# Patient Record
Sex: Male | Born: 1980 | Race: White | Hispanic: No | Marital: Single | Smoking: Never smoker
Health system: Southern US, Community
[De-identification: ages and names within clinical notes are randomized; demographics above are authoritative.]

## PROBLEM LIST (undated history)

## (undated) HISTORY — PX: ESOPHAGEAL DILATION: SHX303

---

## 2010-07-16 ENCOUNTER — Ambulatory Visit (HOSPITAL_COMMUNITY)
Admission: EM | Admit: 2010-07-16 | Discharge: 2010-07-16 | Disposition: A | Discharge status: Home / Self Care | Payer: Self-pay | Attending: Emergency Medicine | Admitting: Emergency Medicine

## 2010-07-16 ENCOUNTER — Emergency Department (HOSPITAL_COMMUNITY): Payer: Self-pay

## 2010-07-16 DIAGNOSIS — L408 Other psoriasis: Secondary | ICD-10-CM | POA: Insufficient documentation

## 2010-07-16 DIAGNOSIS — T18108A Unspecified foreign body in esophagus causing other injury, initial encounter: Secondary | ICD-10-CM | POA: Insufficient documentation

## 2010-07-16 DIAGNOSIS — IMO0002 Reserved for concepts with insufficient information to code with codable children: Secondary | ICD-10-CM | POA: Insufficient documentation

## 2010-07-16 DIAGNOSIS — R066 Hiccough: Secondary | ICD-10-CM | POA: Insufficient documentation

## 2010-08-08 NOTE — Op Note (Signed)
  NAMEANDREWS, TENER               ACCOUNT NO.:  [id number]  MEDICAL RECORD NO.:  [id number]           PATIENT TYPE:  E  LOCATION:  WLED                         FACILITY:  [facility] [place]  PHYSICIAN:  Shirley Friar, MDDATE OF BIRTH:  [birth year]  DATE OF PROCEDURE: DATE OF DISCHARGE:                              OPERATIVE REPORT   INDICATION:  Food impaction.  HISTORY:  The patient a 30 year old male with psoriasis who denies any previous episodes of dysphagia, heartburn or trouble eating who upon eating the first bite of pork chop this afternoon, which was too hot for him, he felt like it hung up and was unable to eat anymore afterwards and could not swallow his saliva since that time.  He tried to drink water since that time, but vomited it back up.  No help with glucagon in the emergency room.  MEDICATIONS: 1. Fentanyl 100 mcg IV. 2. Versed 10 mg IV. 3. Benadryl 25 mg IV.  FINDINGS:  Endoscope was inserted through oropharynx and esophagus was intubated.  In the mid esophagus was a lot of cloudy fluid and upon reaching the distal esophagus, there was a large food bolus that was obstructing the esophageal lumen.  A 4-pronged Talon grasper was used to remove several pieces of food including a large piece of food.  After several attempts, the endoscope was reinserted and Talon grasper was passed through the working channel of the endoscope and upon trying to grasp further pieces of the food bolus, the food bolus was able to be gently advanced into the stomach.  Upon careful inspection of esophagus, no esophageal stricture or ring was noted.  There was mild irritation of the focal area of the distal esophagus with erythema and ulceration consistent with trauma from the food bolus.  Endoscope was passed down to the stomach and revealed normal gastric mucosa.  Retroflexion was done, which revealed normal proximal stomach.  Endoscope was straightened, advanced into the duodenal bulb  and second portion of the duodenum, which were unremarkable.  Endoscope was withdrawn back into the esophagus and esophagus was again carefully evaluated and no esophageal stricture or ring was seen.  There were mild linear furrows noted proximal to the food bolus.  PLAN: 1. Full liquids until the morning and then chew meat slowly and eat     small pieces. 2. Prilosec 20 mg daily for 7 days. 3. If dysphagia recurs, then the patient may need to have esophageal     biopsies and/or dilation.  At this time, there is no visual     esophageal obstruction or esophageal ring, but if dysphagia occurs     further or if he has any additional food impaction, then may need     to consider biopsies to look for eosinophilic esophagitis.     Shirley Friar, MD     VCS/MEDQ  D:  07/16/2010  T:  07/17/2010  Job:  [id number]  Electronically Signed by Charlott Rakes MD on 08/08/2010 11:23:02 AM

## 2013-06-17 ENCOUNTER — Encounter: Payer: Self-pay | Admitting: Sports Medicine

## 2013-06-17 ENCOUNTER — Ambulatory Visit (INDEPENDENT_AMBULATORY_CARE_PROVIDER_SITE_OTHER): Payer: BC Managed Care – PPO | Admitting: Sports Medicine

## 2013-06-17 VITALS — BP 134/74 | HR 97 | Ht 71.0 in | Wt 191.0 lb

## 2013-06-17 DIAGNOSIS — Z299 Encounter for prophylactic measures, unspecified: Secondary | ICD-10-CM | POA: Insufficient documentation

## 2013-06-17 DIAGNOSIS — Z111 Encounter for screening for respiratory tuberculosis: Secondary | ICD-10-CM

## 2013-06-17 DIAGNOSIS — J309 Allergic rhinitis, unspecified: Secondary | ICD-10-CM

## 2013-06-17 DIAGNOSIS — H6120 Impacted cerumen, unspecified ear: Secondary | ICD-10-CM | POA: Insufficient documentation

## 2013-06-17 DIAGNOSIS — J3089 Other allergic rhinitis: Secondary | ICD-10-CM | POA: Insufficient documentation

## 2013-06-17 DIAGNOSIS — L409 Psoriasis, unspecified: Secondary | ICD-10-CM | POA: Insufficient documentation

## 2013-06-17 DIAGNOSIS — L408 Other psoriasis: Secondary | ICD-10-CM

## 2013-06-17 MED ORDER — FEXOFENADINE HCL 180 MG PO TABS
180.0000 mg | ORAL_TABLET | Freq: Every day | ORAL | Status: AC
Start: 2013-06-17 — End: ?

## 2013-06-17 MED ORDER — PREDNISONE 50 MG PO TABS: ORAL_TABLET | ORAL | Status: DC

## 2013-06-17 MED ORDER — MOMETASONE FUROATE 50 MCG/ACT NA SUSP: NASAL | Status: DC

## 2013-06-17 NOTE — Assessment & Plan Note (Signed)
Prednisone burst, referral to dermatology. He will likely need a biologic treatment, he had a good response to Enbrel in the past, we are going to plant a PPD which will be needed before he starts.

## 2013-06-17 NOTE — Progress Notes (Signed)
  Subjective:    CC: Establish care.   HPI:  Psoriasis: Has been present for approximately 10 years, no longer has a dermatologist and needs a referral. He did have a good response to Enbrel, did not respond to methotrexate. Symptoms are severe, persistent.  Allergic rhinitis: Did well with Nasonex, fexofenadine.  Past medical history, Surgical history, Family history not pertinant except as noted below, Social history, Allergies, and medications have been entered into the medical record, reviewed, and no changes needed.   Review of Systems: No headache, visual changes, nausea, vomiting, diarrhea, constipation, dizziness, abdominal pain, skin rash, fevers, chills, night sweats, swollen lymph nodes, weight loss, chest pain, body aches, joint swelling, muscle aches, shortness of breath, mood changes, visual or auditory hallucinations.  Objective:    General: Well Developed, well nourished, and in no acute distress.  Neuro: Alert and oriented x3, extra-ocular muscles intact, sensation grossly intact.  HEENT: Normocephalic, atraumatic, pupils equal round reactive to light, neck supple, no masses, no lymphadenopathy, thyroid nonpalpable. Left external ear canals occluded with cerumen. Skin: Warm and dry, moderate to severe psoriasis is located on his face, back, neck, abdomen. Cardiac: Regular rate and rhythm, no murmurs rubs or gallops.  Respiratory: Clear to auscultation bilaterally. Not using accessory muscles, speaking in full sentences.  Abdominal: Soft, nontender, nondistended, positive bowel sounds, no masses, no organomegaly.  Musculoskeletal: Shoulder, elbow, wrist, hip, knee, ankle stable, and with full range of motion.  Indication: Cerumen impaction of the ear(s) Medical necessity statement: On physical examination, cerumen impairs clinically significant portions of the external auditory canal, and tympanic membrane. Noted obstructive, copious cerumen that cannot be removed without  magnification and instrumentations requiring physician skills Consent: Discussed benefits and risks of procedure and verbal consent obtained Procedure: Patient was prepped for the procedure. Utilized an otoscope to assess and take note of the ear canal, the tympanic membrane, and the presence, amount, and placement of the cerumen. Gentle water irrigation and soft plastic curette was utilized to remove cerumen.  Post procedure examination: shows cerumen was completely removed. Patient tolerated procedure well. The patient is made aware that they may experience temporary vertigo, temporary hearing loss, and temporary discomfort. If these symptom last for more than 24 hours to call the clinic or proceed to the ED.  Impression and Recommendations:    The patient was counselled, risk factors were discussed, anticipatory guidance given.

## 2013-06-17 NOTE — Assessment & Plan Note (Signed)
Nasonex, fexofenadine. Return in a month.

## 2013-06-17 NOTE — Assessment & Plan Note (Signed)
Cerumen removal by physician with curette on the left side.

## 2013-06-17 NOTE — Assessment & Plan Note (Signed)
Checking routine bloodwork. 

## 2013-06-18 ENCOUNTER — Encounter: Payer: Self-pay | Admitting: Sports Medicine

## 2013-06-19 LAB — LIPID PANEL
Cholesterol: 205 mg/dL — ABNORMAL HIGH (ref 0–200)
HDL: 59 mg/dL (ref >39)
LDL Cholesterol: 137 mg/dL — ABNORMAL HIGH (ref 0–99)
Total CHOL/HDL Ratio: 3.5 ratio
Triglycerides: 45 mg/dL (ref <150)
VLDL: 9 mg/dL (ref 0–40)

## 2013-06-19 LAB — CBC
HCT: 41.9 % (ref 39.0–52.0)
Hemoglobin: 14.5 g/dL (ref 13.0–17.0)
MCH: 29.1 pg (ref 26.0–34.0)
MCHC: 34.6 g/dL (ref 30.0–36.0)
MCV: 84 fL (ref 78.0–100.0)
Platelets: 372 10*3/uL (ref 150–400)
RBC: 4.99 MIL/uL (ref 4.22–5.81)
RDW: 12.7 % (ref 11.5–15.5)
WBC: 13 K/uL — ABNORMAL HIGH (ref 4.0–10.5)

## 2013-06-19 LAB — COMPREHENSIVE METABOLIC PANEL
ALT: 31 U/L (ref 0–53)
AST: 22 U/L (ref 0–37)
Albumin: 4.7 g/dL (ref 3.5–5.2)
Alkaline Phosphatase: 54 U/L (ref 39–117)
BUN: 16 mg/dL (ref 6–23)
Potassium: 4.4 mEq/L (ref 3.5–5.3)
Sodium: 140 mEq/L (ref 135–145)
Total Protein: 7.8 g/dL (ref 6.0–8.3)

## 2013-06-19 LAB — COMPREHENSIVE METABOLIC PANEL WITH GFR
CO2: 28 meq/L (ref 19–32)
Calcium: 10.3 mg/dL (ref 8.4–10.5)
Chloride: 102 meq/L (ref 96–112)
Creat: 0.97 mg/dL (ref 0.50–1.35)
Glucose, Bld: 142 mg/dL — ABNORMAL HIGH (ref 70–99)
Total Bilirubin: 0.5 mg/dL (ref 0.2–1.2)

## 2013-06-19 LAB — TSH: TSH: 0.492 u[IU]/mL (ref 0.350–4.500)

## 2013-06-19 LAB — TB SKIN TEST
Induration: 0 mm
TB Skin Test: NEGATIVE

## 2013-06-20 LAB — HEMOGLOBIN A1C
Hgb A1c MFr Bld: 5.3 % (ref <5.7)
Mean Plasma Glucose: 105 mg/dL (ref <117)

## 2013-06-20 LAB — VITAMIN D 25 HYDROXY (VIT D DEFICIENCY, FRACTURES): Vit D, 25-Hydroxy: 69 ng/mL (ref 30–89)

## 2013-07-15 ENCOUNTER — Encounter: Payer: Self-pay | Admitting: Sports Medicine

## 2013-07-15 ENCOUNTER — Ambulatory Visit (INDEPENDENT_AMBULATORY_CARE_PROVIDER_SITE_OTHER): Payer: BC Managed Care – PPO | Admitting: Sports Medicine

## 2013-07-15 VITALS — BP 118/76 | HR 78 | Ht 71.0 in | Wt 193.0 lb

## 2013-07-15 DIAGNOSIS — L408 Other psoriasis: Secondary | ICD-10-CM

## 2013-07-15 DIAGNOSIS — J309 Allergic rhinitis, unspecified: Secondary | ICD-10-CM

## 2013-07-15 DIAGNOSIS — L409 Psoriasis, unspecified: Secondary | ICD-10-CM

## 2013-07-15 DIAGNOSIS — J3089 Other allergic rhinitis: Secondary | ICD-10-CM

## 2013-07-15 MED ORDER — FLUTICASONE PROPIONATE 50 MCG/ACT NA SUSP: NASAL | Status: DC

## 2013-07-15 MED ORDER — PREDNISONE (PAK) 10 MG PO TABS: ORAL_TABLET | ORAL | Status: DC

## 2013-07-15 NOTE — Progress Notes (Signed)
  Subjective:    CC: Followup  HPI: Psoriasis: Improve significantly with steroid burst, currently on biologic injections with his dermatologist.  Perennial allergic rhinitis: Resolved with antihistamines, he did use some Nasacort and was unable to afford Nasonex which was not covered.  Past medical history, Surgical history, Family history not pertinant except as noted below, Social history, Allergies, and medications have been entered into the medical record, reviewed, and no changes needed.   Review of Systems: No fevers, chills, night sweats, weight loss, chest pain, or shortness of breath.   Objective:    General: Well Developed, well nourished, and in no acute distress.  Neuro: Alert and oriented x3, extra-ocular muscles intact, sensation grossly intact.  HEENT: Normocephalic, atraumatic, pupils equal round reactive to light, neck supple, no masses, no lymphadenopathy, thyroid nonpalpable.  Skin: Warm and dry, psoriatic lesions are all cleared on his face, they're still present on the arms and abdomen. Cardiac: Regular rate and rhythm, no murmurs rubs or gallops, no lower extremity edema.  Respiratory: Clear to auscultation bilaterally. Not using accessory muscles, speaking in full sentences.  Impression and Recommendations:

## 2013-07-15 NOTE — Assessment & Plan Note (Signed)
Good response, unfortunately Nasonex was not covered, we are going to switch this to Flonase.

## 2013-07-15 NOTE — Assessment & Plan Note (Signed)
Excellent clearance of lesions with initial burst of prednisone, he is now on biologics injections with his dermatologist. I'm going to do an additional course of prednisone, this time a taper. He will continue treatment with his dermatologist.

## 2014-01-15 ENCOUNTER — Ambulatory Visit: Payer: BC Managed Care – PPO | Admitting: Sports Medicine

## 2014-09-20 ENCOUNTER — Emergency Department (HOSPITAL_BASED_OUTPATIENT_CLINIC_OR_DEPARTMENT_OTHER): Payer: 59

## 2014-09-20 ENCOUNTER — Emergency Department (HOSPITAL_BASED_OUTPATIENT_CLINIC_OR_DEPARTMENT_OTHER)
Admission: EM | Admit: 2014-09-20 | Discharge: 2014-09-20 | Disposition: A | Discharge status: Home / Self Care | Payer: 59 | Attending: Emergency Medicine | Admitting: Emergency Medicine

## 2014-09-20 ENCOUNTER — Encounter (HOSPITAL_BASED_OUTPATIENT_CLINIC_OR_DEPARTMENT_OTHER): Payer: Self-pay | Admitting: Emergency Medicine

## 2014-09-20 DIAGNOSIS — X58XXXA Exposure to other specified factors, initial encounter: Secondary | ICD-10-CM | POA: Diagnosis not present

## 2014-09-20 DIAGNOSIS — Y998 Other external cause status: Secondary | ICD-10-CM | POA: Insufficient documentation

## 2014-09-20 DIAGNOSIS — T18108A Unspecified foreign body in esophagus causing other injury, initial encounter: Secondary | ICD-10-CM | POA: Diagnosis present

## 2014-09-20 DIAGNOSIS — Z79899 Other long term (current) drug therapy: Secondary | ICD-10-CM | POA: Insufficient documentation

## 2014-09-20 DIAGNOSIS — Y9389 Activity, other specified: Secondary | ICD-10-CM | POA: Insufficient documentation

## 2014-09-20 DIAGNOSIS — Y9289 Other specified places as the place of occurrence of the external cause: Secondary | ICD-10-CM | POA: Insufficient documentation

## 2014-09-20 DIAGNOSIS — Z7951 Long term (current) use of inhaled steroids: Secondary | ICD-10-CM | POA: Diagnosis not present

## 2014-09-20 MED ORDER — ONDANSETRON HCL 4 MG/2ML IJ SOLN
4.0000 mg | Freq: Once | INTRAMUSCULAR | Status: AC
  Administered 2014-09-20: 4 mg via INTRAVENOUS
  Filled 2014-09-20: qty 2

## 2014-09-20 MED ORDER — GLUCAGON HCL RDNA (DIAGNOSTIC) 1 MG IJ SOLR
1.0000 mg | Freq: Once | INTRAMUSCULAR | Status: AC
  Administered 2014-09-20: 1 mg via INTRAVENOUS
  Filled 2014-09-20: qty 1

## 2014-09-20 MED ORDER — OMEPRAZOLE 20 MG PO CPDR: 20.0000 mg | DELAYED_RELEASE_CAPSULE | Freq: Every day | ORAL | Status: DC

## 2014-09-20 MED ORDER — GI COCKTAIL ~~LOC~~
ORAL | Status: AC
  Administered 2014-09-20: 30 mL
  Filled 2014-09-20: qty 30

## 2014-09-20 MED ORDER — SODIUM CHLORIDE 0.9 % IV BOLUS (SEPSIS)
1000.0000 mL | Freq: Once | INTRAVENOUS | Status: AC
  Administered 2014-09-20: 1000 mL via INTRAVENOUS

## 2014-09-20 NOTE — ED Provider Notes (Signed)
CSN: [id number]     Arrival date & time 09/20/14  0137 History   First MD Initiated Contact with Patient 09/20/14 0201     Chief Complaint  Patient presents with  . Foreign Body     (Consider location/radiation/quality/duration/timing/severity/associated sxs/prior Treatment) Patient is a 34 y.o. male presenting with foreign body swallowed. The history is provided by the patient.  Swallowed Foreign Body This is a recurrent problem. The current episode started 6 to 12 hours ago. The problem occurs constantly. The problem has not changed since onset.Pertinent negatives include no chest pain, no abdominal pain, no headaches and no shortness of breath. Nothing aggravates the symptoms. Nothing relieves the symptoms. He has tried nothing for the symptoms. The treatment provided no relief.  Ate steak at [employer] and felt it was stuck unable to keep down water.  Happened 4 years ago and was removed by GI and has happened several times since but usually clears at home.    History reviewed. No pertinent past medical history. History reviewed. No pertinent past surgical history. History reviewed. No pertinent family history. History  Substance Use Topics  . Smoking status: Never Smoker   . Smokeless tobacco: Not on file  . Alcohol Use: No    Review of Systems  Constitutional: Negative for fever.  Respiratory: Negative for shortness of breath.   Cardiovascular: Negative for chest pain.  Gastrointestinal: Negative for abdominal pain.  Neurological: Negative for headaches.  All other systems reviewed and are negative.     Allergies  Review of patient's allergies indicates no known allergies.  Home Medications   Prior to Admission medications   Medication Sig Start Date End Date Taking? Authorizing Provider  fexofenadine (ALLEGRA) 180 MG tablet Take 1 tablet (180 mg total) by mouth daily. 06/17/13   Monica Bectonhomas J Thekkekandam, MD  fluticasone (FLONASE) 50 MCG/ACT nasal spray One spray in each  nostril twice a day, use left hand for right nostril, and right hand for left nostril. 07/15/13   Monica Bectonhomas J Thekkekandam, MD  Multiple Vitamin (MULTIVITAMIN) tablet Take 1 tablet by mouth daily.    Historical Provider, MD  predniSONE (STERAPRED UNI-PAK) 10 MG tablet 12 day taper pack, use as directed 07/15/13   Monica Bectonhomas J Thekkekandam, MD  Secukinumab (COSENTYX [place]) Inject 300 mg into the skin.    Historical Provider, MD   BP 124/81 mmHg  Pulse 89  Temp(Src) 98.5 F (36.9 C) (Oral)  Resp 16  Ht 5\' 11"  (1.803 m)  Wt 195 lb (88.451 kg)  BMI 27.21 kg/m2  SpO2 100% Physical Exam  Constitutional: He is oriented to person, place, and time. He appears well-developed and well-nourished. No distress.  HENT:  Head: Normocephalic and atraumatic.  Mouth/Throat: Oropharynx is clear and moist.  Eyes: Conjunctivae are normal. Pupils are equal, round, and reactive to light.  Neck: Normal range of motion. Neck supple.  Cardiovascular: Normal rate, regular rhythm and intact distal pulses.   Pulmonary/Chest: Effort normal and breath sounds normal. No stridor. No respiratory distress. He has no wheezes. He has no rales.  Abdominal: Soft. Bowel sounds are normal. There is no tenderness. There is no rebound and no guarding.  Musculoskeletal: Normal range of motion.  Neurological: He is alert and oriented to person, place, and time.  Skin: Skin is warm and dry.  Psychiatric: He has a normal mood and affect.    ED Course  Procedures (including critical care time) Labs Review Labs Reviewed - No data to display  Imaging Review No results  found.   EKG Interpretation None      MDM   Final diagnoses:  None    Medications  sodium chloride 0.9 % bolus 1,000 mL (0 mLs Intravenous Stopped 09/20/14 0327)  ondansetron (ZOFRAN) injection 4 mg (4 mg Intravenous Given 09/20/14 0245)  glucagon (human recombinant) (GLUCAGEN) injection 1 mg (1 mg Intravenous Given 09/20/14 0254)  gi cocktail suspension (30 mLs   Given 09/20/14 0325)   Felt it pass post glucagon, now swallowing secretions and gi cocktail went down.  Prilosec x 1 month follow up with GI for endoscopy.  Soft diet until cleared by GI   Adaja Wander, MD 09/20/14 [phone]

## 2014-09-20 NOTE — ED Notes (Signed)
Patient states that he ate a piece of steak about 9 pm and feels like there is a piece stuck in his throat

## 2014-09-23 ENCOUNTER — Telehealth: Payer: Self-pay

## 2014-09-23 DIAGNOSIS — L409 Psoriasis, unspecified: Secondary | ICD-10-CM

## 2014-09-23 DIAGNOSIS — T18108A Unspecified foreign body in esophagus causing other injury, initial encounter: Secondary | ICD-10-CM | POA: Insufficient documentation

## 2014-09-23 NOTE — Telephone Encounter (Signed)
Patient wife called requesting a referral for Dermatology for Psoriasis  and Gastroenterology. Katreena Schupp,CMA

## 2014-09-23 NOTE — Telephone Encounter (Signed)
Orders placed.

## 2015-04-07 ENCOUNTER — Telehealth: Payer: Self-pay

## 2015-04-07 DIAGNOSIS — J3089 Other allergic rhinitis: Secondary | ICD-10-CM

## 2015-04-07 NOTE — Telephone Encounter (Signed)
Referral placed.

## 2015-04-07 NOTE — Telephone Encounter (Signed)
Pt would like to be referred to an allergist. States has a history and has received injections as a child.

## 2015-11-12 ENCOUNTER — Encounter: Payer: Self-pay | Admitting: Sports Medicine

## 2015-11-12 ENCOUNTER — Ambulatory Visit (INDEPENDENT_AMBULATORY_CARE_PROVIDER_SITE_OTHER): Payer: BLUE CROSS/BLUE SHIELD | Admitting: Sports Medicine

## 2015-11-12 DIAGNOSIS — L409 Psoriasis, unspecified: Secondary | ICD-10-CM | POA: Diagnosis not present

## 2015-11-12 DIAGNOSIS — Z299 Encounter for prophylactic measures, unspecified: Secondary | ICD-10-CM

## 2015-11-12 LAB — HEMOGLOBIN A1C
Hgb A1c MFr Bld: 5.1 % (ref <5.7)
Mean Plasma Glucose: 100 mg/dL

## 2015-11-12 LAB — CBC
HCT: 43.4 % (ref 38.5–50.0)
Hemoglobin: 14.7 g/dL (ref 13.2–17.1)
MCH: 28.8 pg (ref 27.0–33.0)
MCHC: 33.9 g/dL (ref 32.0–36.0)
MCV: 84.9 fL (ref 80.0–100.0)
MPV: 8.9 fL (ref 7.5–12.5)
Platelets: 337 10*3/uL (ref 140–400)
RBC: 5.11 MIL/uL (ref 4.20–5.80)
RDW: 12.9 % (ref 11.0–15.0)
WBC: 8.1 K/uL (ref 3.8–10.8)

## 2015-11-12 NOTE — Progress Notes (Signed)
  Subjective:    CC: Complete physical exam  HPI:  This is a pleasant 35 year old male here for his physical, psoriasis is doing okay, he did, his medication because of insurance issue, but things are improving. He is having some issues with low sex drive and would like me to check his testosterone.  Past medical history, Surgical history, Family history not pertinant except as noted below, Social history, Allergies, and medications have been entered into the medical record, reviewed, and no changes needed.   Review of Systems: No headache, visual changes, nausea, vomiting, diarrhea, constipation, dizziness, abdominal pain, skin rash, fevers, chills, night sweats, swollen lymph nodes, weight loss, chest pain, body aches, joint swelling, muscle aches, shortness of breath, mood changes, visual or auditory hallucinations.  Objective:   General: Well Developed, well nourished, and in no acute distress.  Neuro: Alert and oriented x3, extra-ocular muscles intact, sensation grossly intact. Cranial nerves II through XII are intact, motor, sensory, and coordinative functions are all intact. HEENT: Normocephalic, atraumatic, pupils equal round reactive to light, neck supple, no masses, no lymphadenopathy, thyroid nonpalpable. Oropharynx, nasopharynx, external ear canals are unremarkable. Skin: Warm and dry, no rashes noted.  Cardiac: Regular rate and rhythm, no murmurs rubs or gallops.  Respiratory: Clear to auscultation bilaterally. Not using accessory muscles, speaking in full sentences.  Abdominal: Soft, nontender, nondistended, positive bowel sounds, no masses, no organomegaly.  Musculoskeletal: Shoulder, elbow, wrist, hip, knee, ankle stable, and with full range of motion.  Impression and Recommendations:    The patient was counselled, risk factors were discussed, anticipatory guidance given.

## 2015-11-12 NOTE — Assessment & Plan Note (Signed)
Complete physical as above with routine blood work including tuberculosis screening, patient is relatively immune compromised with medication Having somewhat low sex drive, checking testosterone, FSH, LH, sexual therapy if insufficient response.

## 2015-11-13 LAB — COMPREHENSIVE METABOLIC PANEL
BUN: 11 mg/dL (ref 7–25)
CO2: 26 mmol/L (ref 20–31)
Chloride: 103 mmol/L (ref 98–110)
Creat: 1.02 mg/dL (ref 0.60–1.35)
Sodium: 140 mmol/L (ref 135–146)

## 2015-11-13 LAB — TSH: TSH: 1.7 mIU/L (ref 0.40–4.50)

## 2015-11-13 LAB — COMPREHENSIVE METABOLIC PANEL WITH GFR
ALT: 29 U/L (ref 9–46)
AST: 23 U/L (ref 10–40)
Albumin: 4.6 g/dL (ref 3.6–5.1)
Alkaline Phosphatase: 45 U/L (ref 40–115)
Calcium: 9.5 mg/dL (ref 8.6–10.3)
Glucose, Bld: 91 mg/dL (ref 65–99)
Potassium: 4.4 mmol/L (ref 3.5–5.3)
Total Bilirubin: 0.8 mg/dL (ref 0.2–1.2)
Total Protein: 7.3 g/dL (ref 6.1–8.1)

## 2015-11-13 LAB — LIPID PANEL
Cholesterol: 198 mg/dL (ref 125–200)
HDL: 57 mg/dL (ref >=40)
LDL Cholesterol: 122 mg/dL (ref <130)
Total CHOL/HDL Ratio: 3.5 Ratio (ref <=5.0)
Triglycerides: 94 mg/dL (ref <150)
VLDL: 19 mg/dL (ref <30)

## 2015-11-13 LAB — LUTEINIZING HORMONE: LH: 5.5 m[IU]/mL (ref 1.5–9.3)

## 2015-11-13 LAB — VITAMIN D 25 HYDROXY (VIT D DEFICIENCY, FRACTURES): Vit D, 25-Hydroxy: 31 ng/mL (ref 30–100)

## 2015-11-13 LAB — FOLLICLE STIMULATING HORMONE: FSH: 4.3 m[IU]/mL (ref 1.6–8.0)

## 2015-11-14 LAB — QUANTIFERON TB GOLD ASSAY (BLOOD)
Interferon Gamma Release Assay: NEGATIVE
Mitogen-Nil: >10 IU/mL
Quantiferon Nil Value: 0.02 [IU]/mL
Quantiferon Tb Ag Minus Nil Value: 0 IU/mL

## 2015-11-15 LAB — TESTOS,TOTAL,FREE AND SHBG (FEMALE)
Sex Hormone Binding Glob.: 22 nmol/L (ref 10–50)
Testosterone, Free: 80.3 pg/mL (ref 35.0–155.0)
Testosterone,Total,LC/MS/MS: 456 ng/dL (ref 250–1100)

## 2016-04-06 IMAGING — DX DG NECK SOFT TISSUE
2 series · 2 of 2 positions shown · non-contrast
Comparison: None.

CLINICAL DATA: Sensation of piece of steak stuck in throat. Initial
encounter.

EXAM:
NECK SOFT TISSUES - 1+ VIEW

[neck lat]
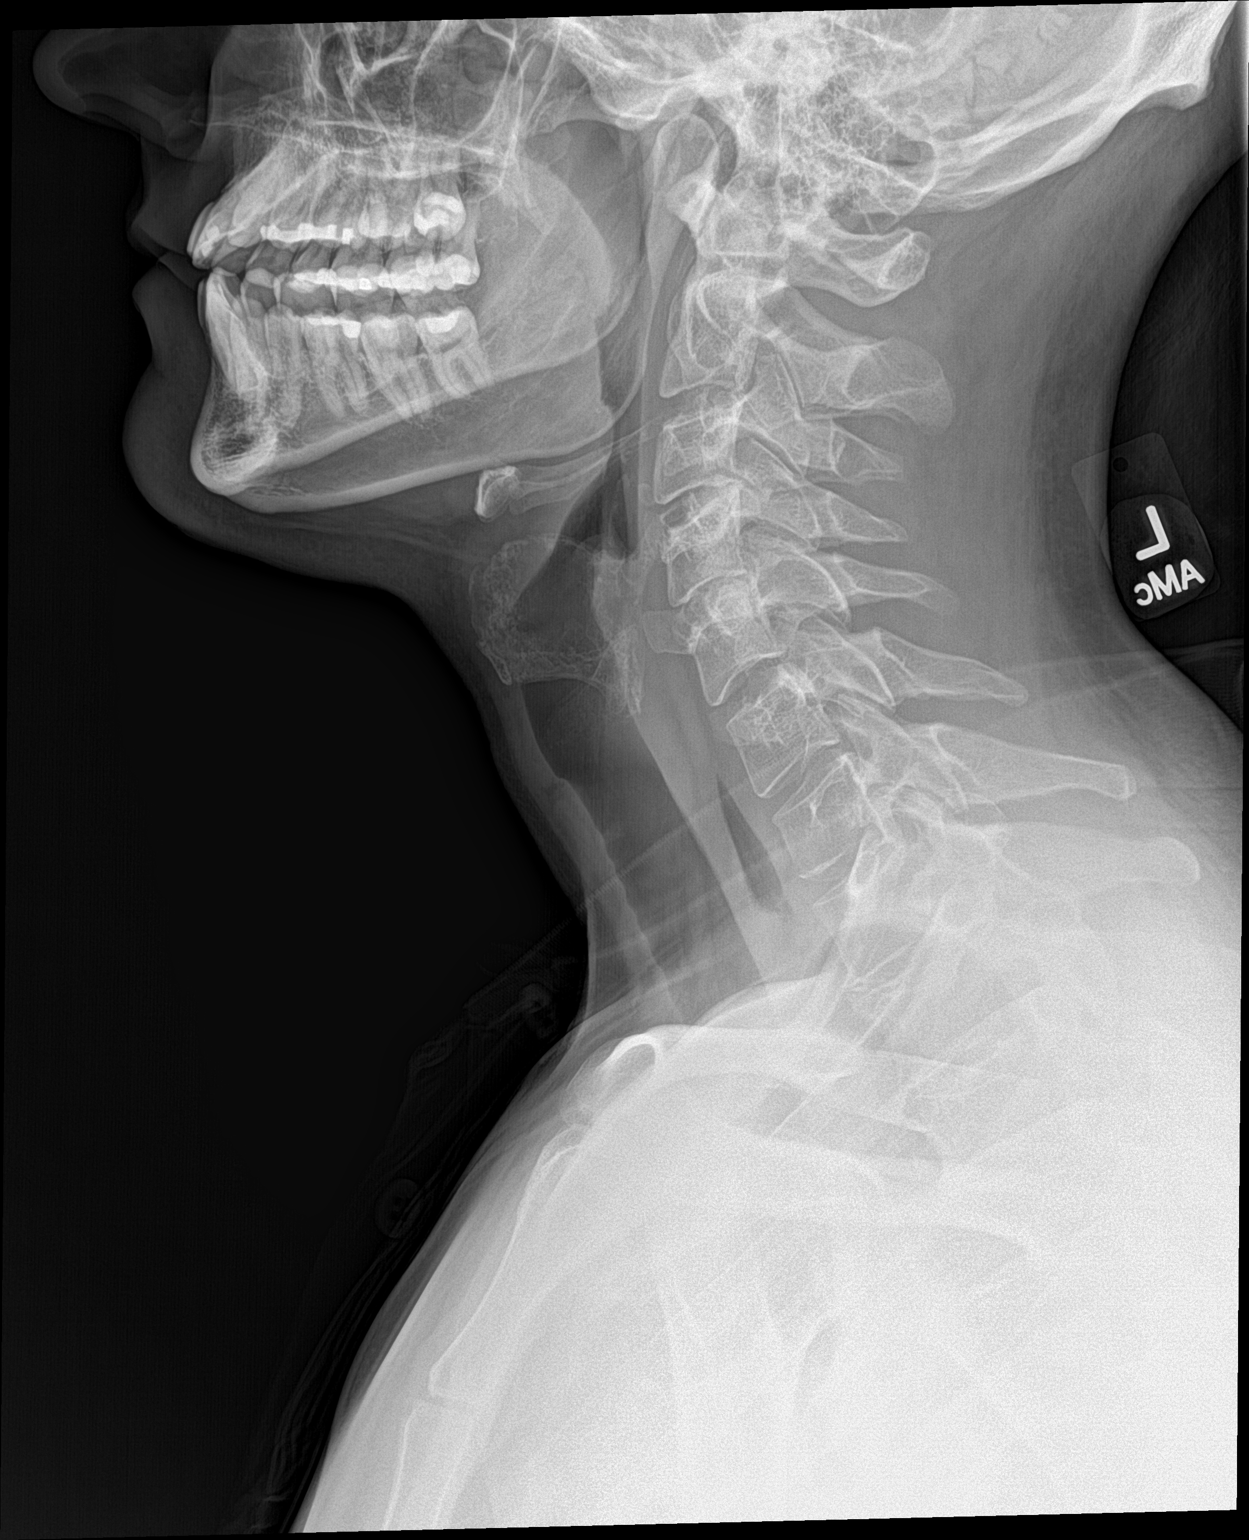

[neck ap]
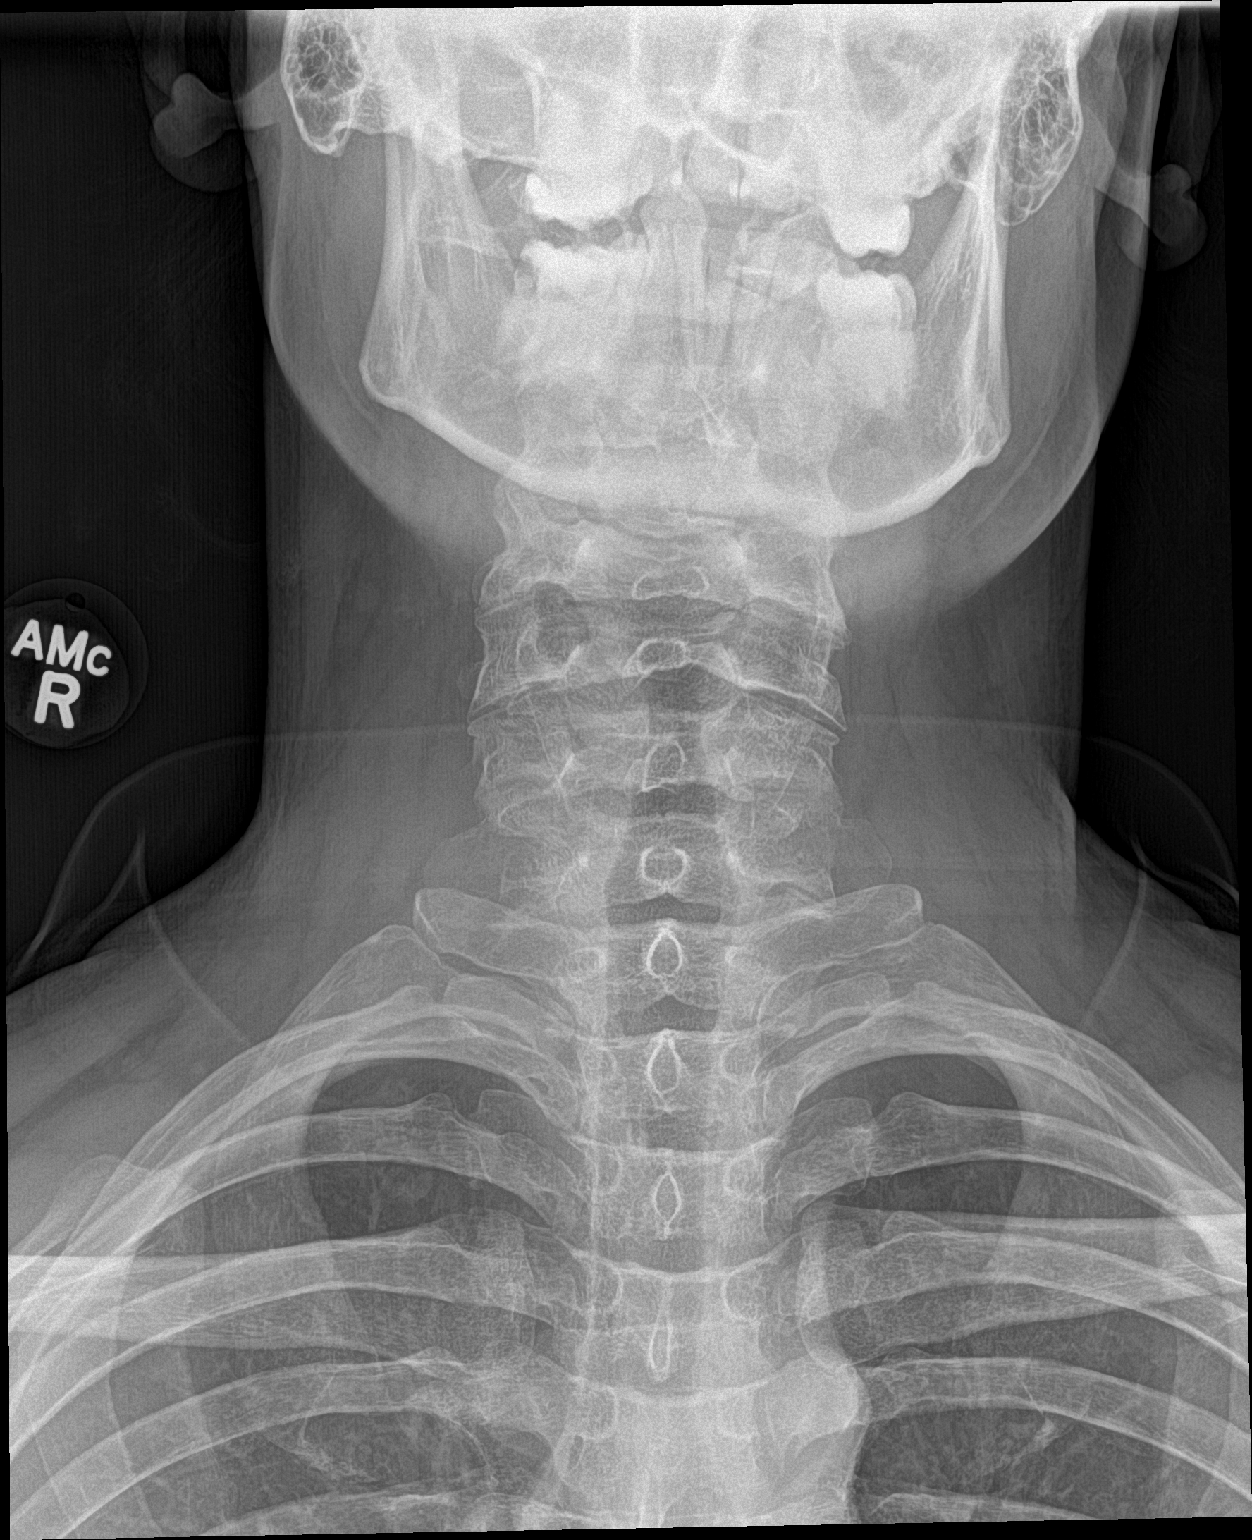

[2 of 2 positions shown; findings below may reference images not displayed]

FINDINGS: The nasopharynx, oropharynx and hypopharynx are unremarkable. The
proximal trachea is grossly unremarkable.

The pattern of air within the proximal esophagus suggests layering
fluid within the proximal esophagus. A piece of steak stuck within
the proximal to mid esophagus cannot be excluded, given clinical
concern.

Prevertebral soft tissues are within normal limits. There is no
evidence of fracture or subluxation along the cervical spine.
Vertebral bodies demonstrate normal height and alignment.
Intervertebral disc spaces are preserved. The visualized paranasal
sinuses and mastoid air cells are well-aerated.

The visualized portions of the lung apices are grossly clear.
IMPRESSION: Pattern of air within the proximal esophagus suggests layering fluid
in the proximal esophagus. Given clinical concern, this could
reflect a piece of steak stuck within the proximal to mid esophagus.
Would follow-up for persistent symptoms.

## 2016-06-26 ENCOUNTER — Ambulatory Visit (HOSPITAL_BASED_OUTPATIENT_CLINIC_OR_DEPARTMENT_OTHER)
Admission: EM | Admit: 2016-06-26 | Discharge: 2016-06-26 | Disposition: A | Discharge status: Home / Self Care | Payer: BLUE CROSS/BLUE SHIELD | Attending: Emergency Medicine | Admitting: Emergency Medicine

## 2016-06-26 ENCOUNTER — Encounter (HOSPITAL_COMMUNITY)
Admission: EM | Disposition: A | Discharge status: Home / Self Care | Payer: Self-pay | Source: Home / Self Care | Attending: Emergency Medicine

## 2016-06-26 ENCOUNTER — Encounter (HOSPITAL_BASED_OUTPATIENT_CLINIC_OR_DEPARTMENT_OTHER): Payer: Self-pay | Admitting: *Deleted

## 2016-06-26 DIAGNOSIS — X58XXXA Exposure to other specified factors, initial encounter: Secondary | ICD-10-CM | POA: Insufficient documentation

## 2016-06-26 DIAGNOSIS — K209 Esophagitis, unspecified: Secondary | ICD-10-CM | POA: Diagnosis not present

## 2016-06-26 DIAGNOSIS — T18128A Food in esophagus causing other injury, initial encounter: Secondary | ICD-10-CM | POA: Insufficient documentation

## 2016-06-26 DIAGNOSIS — W44F3XA Food entering into or through a natural orifice, initial encounter: Secondary | ICD-10-CM | POA: Insufficient documentation

## 2016-06-26 HISTORY — PX: ESOPHAGOGASTRODUODENOSCOPY: SHX5428

## 2016-06-26 SURGERY — EGD (ESOPHAGOGASTRODUODENOSCOPY)
Anesthesia: Moderate Sedation

## 2016-06-26 MED ORDER — OMEPRAZOLE 40 MG PO CPDR: 40.0000 mg | DELAYED_RELEASE_CAPSULE | Freq: Every day | ORAL | 0 refills | Status: DC

## 2016-06-26 MED ORDER — FENTANYL CITRATE (PF) 100 MCG/2ML IJ SOLN
INTRAMUSCULAR | Status: AC
  Filled 2016-06-26: qty 2

## 2016-06-26 MED ORDER — FENTANYL CITRATE (PF) 100 MCG/2ML IJ SOLN
INTRAMUSCULAR | Status: DC | PRN
  Administered 2016-06-26 (×3): 25 ug via INTRAVENOUS

## 2016-06-26 MED ORDER — MIDAZOLAM HCL 10 MG/2ML IJ SOLN
INTRAMUSCULAR | Status: DC | PRN
  Administered 2016-06-26 (×2): 2 mg via INTRAVENOUS
  Administered 2016-06-26: 1 mg via INTRAVENOUS

## 2016-06-26 MED ORDER — NITROGLYCERIN 0.4 MG SL SUBL
0.4000 mg | SUBLINGUAL_TABLET | Freq: Once | SUBLINGUAL | Status: AC
  Administered 2016-06-26: 0.4 mg via SUBLINGUAL
  Filled 2016-06-26: qty 1

## 2016-06-26 MED ORDER — DIPHENHYDRAMINE HCL 50 MG/ML IJ SOLN
INTRAMUSCULAR | Status: AC
  Filled 2016-06-26: qty 1

## 2016-06-26 MED ORDER — SODIUM CHLORIDE 0.9 % IV BOLUS (SEPSIS)
1000.0000 mL | Freq: Once | INTRAVENOUS | Status: AC
  Administered 2016-06-26: 1000 mL via INTRAVENOUS

## 2016-06-26 MED ORDER — LORAZEPAM 2 MG/ML IJ SOLN
0.5000 mg | Freq: Once | INTRAMUSCULAR | Status: AC
  Administered 2016-06-26: 0.5 mg via INTRAVENOUS
  Filled 2016-06-26: qty 1

## 2016-06-26 MED ORDER — DIPHENHYDRAMINE HCL 50 MG/ML IJ SOLN
INTRAMUSCULAR | Status: DC | PRN
  Administered 2016-06-26: 50 mg via INTRAVENOUS

## 2016-06-26 MED ORDER — MIDAZOLAM HCL 5 MG/ML IJ SOLN
INTRAMUSCULAR | Status: AC
  Filled 2016-06-26: qty 2

## 2016-06-26 MED ORDER — GLUCAGON HCL RDNA (DIAGNOSTIC) 1 MG IJ SOLR
1.0000 mg | Freq: Once | INTRAMUSCULAR | Status: AC
  Administered 2016-06-26: 1 mg via INTRAVENOUS
  Filled 2016-06-26: qty 1

## 2016-06-26 NOTE — ED Provider Notes (Signed)
Patient evaluated by GI and had egd with pushing of food into stomach.   Gi indicates to nursing pt ready for d/c.   Patient is awake and alert. No resp depression.  Pulse ox 98%.  Patient is asymptomatic, and currently appears stable for d/c.  D/c instructions per gi.  Vitals:   06/26/16 1615 06/26/16 1630  BP: 105/63 114/76  Pulse: 81 86  Resp: 12 21  Temp:        Cathren LaineKevin Ronny Korff, MD 06/26/16 1645

## 2016-06-26 NOTE — Interval H&P Note (Signed)
History and Physical Interval Note:  06/26/2016 2:56 PM  Keith Stewart  has presented today for surgery, with the diagnosis of food impaction  The various methods of treatment have been discussed with the patient and family. After consideration of risks, benefits and other options for treatment, the patient has consented to  Procedure(s): ESOPHAGOGASTRODUODENOSCOPY (EGD) (N/A) as a surgical intervention .  The patient's history has been reviewed, patient examined, no change in status, stable for surgery.  I have reviewed the patient's chart and labs.  Questions were answered to the patient's satisfaction.     Reeves ForthSteven Paul Jettson Crable

## 2016-06-26 NOTE — ED Notes (Signed)
Bed: [id number] Expected date:  Expected time:  Means of arrival:  Comments: Endoscopy is being performed. Pt. In "ENDO" in EP[facility]

## 2016-06-26 NOTE — ED Provider Notes (Signed)
[facility] Provider Note   CSN: [id number] Arrival date & time: 06/26/16  [id number]     History   Chief Complaint Chief Complaint  Patient presents with  . Airway Obstruction    HPI Keith Stewart is a 36 y.o. male with a PMHx of psoriasis and esophageal impaction x2 (most recently 09/20/14, cleared after 1mg  glucagon in the ED) s/p esophageal dilation by Dr. Norma Fredricksonoledo of [facility] in [place] in late 2016, who presents to the ED with complaints of feeling like there is food stuck in his throat. Patient states that he was eating boneless chicken last night around 7:30pm when a piece got stuck in his throat. He attempted to drink Coke and water to try to pass the impaction, but every time he tried it seemed to get stuck in his upper esophagus and then he regurgitated it back up. He has been unable to tolerate any liquids since this happened. He reports that he has been drooling and has required spitting his saliva out due to inability to pass it around the obstruction. He has not tried anything other than Coke and water. Symptoms worsen with eating/drinking. He also reports having a mild sore throat due to the gagging and regurgitation when he was trying to pass the obstruction yesterday. He states that last time this happened, when he was in the ED on 09/20/14, he was able to pass the obstruction after 1 mg glucagon and drinking a soda so he is hopeful that it will pass again today without requiring an EGD, given that today's impaction seems milder than previous impactions. Per his report, his last EGD in 2016 did not reveal any "rings" or strictures that would be the source of his food impactions, so his GI dr thought that maybe it's due to a food allergy; however they have not found a specific cause to this issue yet.   He denies fevers, chills, trismus, wheezing, CP, SOB, abd pain, N/V/D/C, hematuria, dysuria, myalgias, arthralgias, numbness, tingling, focal weakness,  or any other complaints at this time.    The history is provided by the patient and medical records. No language interpreter was used.  GI Problem  This is a recurrent problem. The current episode started yesterday. The problem occurs constantly. The problem has not changed since onset.Pertinent negatives include no chest pain, no abdominal pain and no shortness of breath. The symptoms are aggravated by eating and drinking. Nothing relieves the symptoms. He has tried water for the symptoms. The treatment provided no relief.    History reviewed. No pertinent past medical history.  Patient Active Problem List   Diagnosis Date Noted  . Esophageal foreign body 09/23/2014  . Psoriasis 06/17/2013  . Preventive measure 06/17/2013  . Perennial allergic rhinitis 06/17/2013    Past Surgical History:  Procedure Laterality Date  . ESOPHAGEAL DILATION         Home Medications    Prior to Admission medications   Medication Sig Start Date End Date Taking? Authorizing Provider  fexofenadine (ALLEGRA) 180 MG tablet Take 1 tablet (180 mg total) by mouth daily. 06/17/13   Monica Bectonhomas J Thekkekandam, MD  fluticasone (FLONASE) 50 MCG/ACT nasal spray One spray in each nostril twice a day, use left hand for right nostril, and right hand for left nostril. 07/15/13   Monica Bectonhomas J Thekkekandam, MD  Multiple Vitamin (MULTIVITAMIN) tablet Take 1 tablet by mouth daily.    Historical Provider, MD    Family History History reviewed. No pertinent family  history.  Social History Social History  Substance Use Topics  . Smoking status: Never Smoker  . Smokeless tobacco: Never Used  . Alcohol use No     Allergies   Patient has no known allergies.   Review of Systems Review of Systems  Constitutional: Negative for chills and fever.  HENT: Positive for drooling, sore throat and trouble swallowing.   Respiratory: Negative for shortness of breath and wheezing.   Cardiovascular: Negative for chest pain.    Gastrointestinal: Negative for abdominal pain, constipation, diarrhea, nausea and vomiting.  Genitourinary: Negative for dysuria and hematuria.  Musculoskeletal: Negative for arthralgias and myalgias.  Skin: Negative for color change.  Allergic/Immunologic: Negative for immunocompromised state.  Neurological: Negative for weakness and numbness.  Psychiatric/Behavioral: Negative for confusion.   10 Systems reviewed and are negative for acute change except as noted in the HPI.   Physical Exam Updated Vital Signs BP 135/93 (BP Location: Right Arm)   Pulse 88   Temp 98.7 F (37.1 C) (Oral)   Resp 16   Ht 5\' 11"  (1.803 m)   Wt 88.5 kg   SpO2 98%   BMI 27.20 kg/m   Physical Exam  Constitutional: He is oriented to person, place, and time. Vital signs are normal. He appears well-developed and well-nourished.  Non-toxic appearance. No distress.  Afebrile, nontoxic, NAD  HENT:  Head: Normocephalic and atraumatic.  Nose: Nose normal.  Mouth/Throat: Uvula is midline and mucous membranes are normal. No trismus in the jaw. No uvula swelling. Posterior oropharyngeal erythema present. No oropharyngeal exudate, posterior oropharyngeal edema or tonsillar abscesses. Tonsils are 0 on the right. Tonsils are 0 on the left. No tonsillar exudate.  Pt is spitting saliva out into a cup, but doesn't appear to be drooling. Oropharynx mildly injected however airway intact, no significant edema, without uvular swelling or deviation, no trismus, no tonsillar swelling, no exudates. No stridor or wheezing, no voice hoarseness.  Eyes: Conjunctivae and EOM are normal. Right eye exhibits no discharge. Left eye exhibits no discharge.  Neck: Trachea normal, normal range of motion and phonation normal. Neck supple. No tracheal tenderness present. No tracheal deviation present. No thyroid mass and no thyromegaly present.  No thyromegaly or thyroid masses appreciated. No tracheal tenderness or deviation. No stridor or  vocal hoarseness.   Cardiovascular: Normal rate, regular rhythm, normal heart sounds and intact distal pulses.  Exam reveals no gallop and no friction rub.   No murmur heard. Pulmonary/Chest: Effort normal and breath sounds normal. No stridor. No respiratory distress. He has no decreased breath sounds. He has no wheezes. He has no rhonchi. He has no rales.  CTAB in all lung fields, no w/r/r, no hypoxia or increased WOB, speaking in full sentences, SpO2 98% on RA   Abdominal: Soft. Normal appearance and bowel sounds are normal. He exhibits no distension. There is no tenderness. There is no rigidity, no rebound, no guarding, no CVA tenderness, no tenderness at McBurney's point and negative Murphy's sign.  Soft, NTND, +BS throughout, no r/g/r, neg murphy's, neg mcburney's, no CVA TTP   Musculoskeletal: Normal range of motion.  Neurological: He is alert and oriented to person, place, and time. He has normal strength. No sensory deficit.  Skin: Skin is warm, dry and intact. No rash noted.  Psychiatric: He has a normal mood and affect.  Nursing note and vitals reviewed.    ED Treatments / Results  Labs (all labs ordered are listed, but only abnormal results are displayed) Labs Reviewed -  No data to display  EKG  EKG Interpretation None       Radiology No results found.  Procedures Procedures (including critical care time)  Medications Ordered in ED Medications  glucagon (human recombinant) (GLUCAGEN) injection 1 mg (1 mg Intravenous Given 06/26/16 1103)  nitroGLYCERIN (NITROSTAT) SL tablet 0.4 mg (0.4 mg Sublingual Given 06/26/16 1135)  LORazepam (ATIVAN) injection 0.5 mg (0.5 mg Intravenous Given 06/26/16 1107)  sodium chloride 0.9 % bolus 1,000 mL (0 mLs Intravenous Stopped 06/26/16 1130)     Initial Impression / Assessment and Plan / ED Course  I have reviewed the triage vital signs and the nursing notes.  Pertinent labs & imaging results that were available during my care of  the patient were reviewed by me and considered in my medical decision making (see chart for details).     36 y.o. male here with food impaction, hx of similar issue x2 in the past, most recently 09/20/14 when he came to ED for it and it cleared after glucagon 1mg  IV. Later in 2016 he had an esophageal dilation by Dr. Norma Fredrickson in [place]; per his report, there were no findings on the EGD to indicate why he has this happen, though his GI dr thinks it may be from a food allergy. On exam, pt is spitting his saliva out into a cup, but doesn't appear to be drooling, throat irritated but airway intact, clear lung sounds, no stridor or wheezing, no voice hoarseness, no trachea tenderness, no thyromegaly. Doubt need for imaging given that the food did not contain any radioopaque materials; will proceed with glucagon, IV ativan, SL NTG, and then attempt to give pt a soda to see if it will pass the impaction. Will reassess shortly  12:18 PM Pt attempted to consume coca-cola after meds given, and regurgitated it back up, still feels food impaction is present; will proceed with GI consult for likely endoscopy.  12:59 PM Dr. Norma Fredrickson returning page, informs me that he does not take call for our facility therefore will proceed with consulting our GI specialists here. Will reassess shortly.  1:25 PM Dr. Adela Lank of GI returning page and will set up for endoscopy at [facility], wants him sent to ED and he will meet him there. Will send via private vehicle now. Pt has a ride to take him. Pt advised of plan and agrees, stable at time of transport. Please see Dr. Lanetta Inch notes for further documentation of care/dispo. [facility] ED staff advised.   Final Clinical Impressions(s) / ED Diagnoses   Final diagnoses:  Food impaction of esophagus, initial encounter    New Prescriptions New Prescriptions   No medications on file     [address], PA-C 06/26/16 1326    Pricilla Loveless, MD 06/29/16 [phone]

## 2016-06-26 NOTE — Discharge Instructions (Addendum)
It was our pleasure to provide your ER care today - we hope that you feel better.  Take omeprazole as prescribed.  Clear liquid diet, then advance to soft diet.   Avoid eating meat until follow up with your gi doctor.  Follow up with gi doctor as discussed with them.   Return to ER if worse, symptoms recur, trouble breathing, chest pain, fevers, other concern.  You were given sedation for your procedure  -  no driving until tomorrow.

## 2016-06-26 NOTE — ED Notes (Signed)
PA-C made aware that pt attempted to drink Coke around 1145 and was unsuccessful. Plan of care: consult to GI.

## 2016-06-26 NOTE — Consult Note (Signed)
   HPI :  36 y/o male with a history of seasonal allergies and dysphagia presenting to the ER with a food impaction. He reports a history of a few impactions historically with history of esophageal dilation. He ate chicken last night and states it got stuck after he swallowed it. He has not been able to tolerate any secretions since that time, or any liquids or solids. He denies any chest pains or shortness of breath. [facility], given glucagon without relief. [facility] and [facility]. He denies any baseline reflux symptoms.   History reviewed. No pertinent past medical history.   Past Surgical History:  Procedure Laterality Date  . ESOPHAGEAL DILATION     History reviewed. No pertinent family history. Social History  Substance Use Topics  . Smoking status: Never Smoker  . Smokeless tobacco: Never Used  . Alcohol use No   No current facility-administered medications for this encounter.    Current Outpatient Prescriptions  Medication Sig Dispense Refill  . fexofenadine (ALLEGRA) 180 MG tablet Take 1 tablet (180 mg total) by mouth daily. 90 tablet 3  . fluticasone (FLONASE) 50 MCG/ACT nasal spray One spray in each nostril twice a day, use left hand for right nostril, and right hand for left nostril. 48 g 3  . Multiple Vitamin (MULTIVITAMIN) tablet Take 1 tablet by mouth daily.     No Known Allergies   Review of Systems: All systems reviewed and negative except where noted in HPI.   Lab Results  Component Value Date   WBC 8.1 11/12/2015   HGB 14.7 11/12/2015   HCT 43.4 11/12/2015   MCV 84.9 11/12/2015   PLT 337 11/12/2015    Physical Exam: BP 121/89   Pulse 104   Temp 98.7 F (37.1 C) (Oral)   Resp 16   Ht 5\' 11"  (1.803 m)   Wt 195 lb (88.5 kg)   SpO2 98%   BMI 27.20 kg/m  Constitutional: Pleasant,well-developed, male in no acute distress. HEENT: Normocephalic and atraumatic. Conjunctivae are normal.  No scleral icterus. Neck supple.  Cardiovascular: Normal rate, regular rhythm.  Pulmonary/chest: Effort normal and breath sounds normal. Abdominal: Soft, nondistended, nontender.  Extremities: no edema Lymphadenopathy: No cervical adenopathy noted. Neurological: Alert and oriented to person place and time. Skin: Skin is warm and dry. No rashes noted. Psychiatric: Normal mood and affect. Behavior is normal.   ASSESSMENT AND PLAN: 36 y/o male with history of dysphagia and prior food impactions, presenting with a food impaction which occurred last night, intolerant of anything PO. I discussed what food impaction is and risk of esophageal perforation without removal, and recommend EGD. I discussed risks / benefits of EGD with him and following this discussion he wished to proceed. Further recommendations pending this result.   Ileene PatrickSteven Marvella Jenning, MD [facility] Pager [phone]

## 2016-06-26 NOTE — ED Notes (Signed)
He has just arrived, having been seen at [facility] today. Dr. Adela LankArmbruster meets him as he enters room 15, and his endoscopy crew are here and setting up (for E.G.D.) also.  Pt. Is awake, alert, ambulatory and in no distress.

## 2016-06-26 NOTE — H&P (View-Only) (Signed)
   HPI :  36 y/o male with a history of seasonal allergies and dysphagia presenting to the ER with a food impaction. He reports a history of a few impactions historically with history of esophageal dilation. He ate chicken last night and states it got stuck after he swallowed it. He has not been able to tolerate any secretions since that time, or any liquids or solids. He denies any chest pains or shortness of breath. He initially presented to [place] ER, given glucagon without relief. Took POV to arrive at [facility] for evaluation and emergent endoscopy. He denies any baseline reflux symptoms.   History reviewed. No pertinent past medical history.   Past Surgical History:  Procedure Laterality Date  . ESOPHAGEAL DILATION     History reviewed. No pertinent family history. Social History  Substance Use Topics  . Smoking status: Never Smoker  . Smokeless tobacco: Never Used  . Alcohol use No   No current facility-administered medications for this encounter.    Current Outpatient Prescriptions  Medication Sig Dispense Refill  . fexofenadine (ALLEGRA) 180 MG tablet Take 1 tablet (180 mg total) by mouth daily. 90 tablet 3  . fluticasone (FLONASE) 50 MCG/ACT nasal spray One spray in each nostril twice a day, use left hand for right nostril, and right hand for left nostril. 48 g 3  . Multiple Vitamin (MULTIVITAMIN) tablet Take 1 tablet by mouth daily.     No Known Allergies   Review of Systems: All systems reviewed and negative except where noted in HPI.   Lab Results  Component Value Date   WBC 8.1 11/12/2015   HGB 14.7 11/12/2015   HCT 43.4 11/12/2015   MCV 84.9 11/12/2015   PLT 337 11/12/2015    Physical Exam: BP 121/89   Pulse 104   Temp 98.7 F (37.1 C) (Oral)   Resp 16   Ht 5' 11" (1.803 m)   Wt 195 lb (88.5 kg)   SpO2 98%   BMI 27.20 kg/m  Constitutional: Pleasant,well-developed, male in no acute distress. HEENT: Normocephalic and atraumatic. Conjunctivae are normal.  No scleral icterus. Neck supple.  Cardiovascular: Normal rate, regular rhythm.  Pulmonary/chest: Effort normal and breath sounds normal. Abdominal: Soft, nondistended, nontender.  Extremities: no edema Lymphadenopathy: No cervical adenopathy noted. Neurological: Alert and oriented to person place and time. Skin: Skin is warm and dry. No rashes noted. Psychiatric: Normal mood and affect. Behavior is normal.   ASSESSMENT AND PLAN: 36 y/o male with history of dysphagia and prior food impactions, presenting with a food impaction which occurred last night, intolerant of anything PO. I discussed what food impaction is and risk of esophageal perforation without removal, and recommend EGD. I discussed risks / benefits of EGD with him and following this discussion he wished to proceed. Further recommendations pending this result.   Marny Smethers, MD [facility] Pager [phone]   

## 2016-06-26 NOTE — ED Notes (Addendum)
[facility]. Endoscopy team at bedside. GI MD speaking with pt. Pt in no acute distress and conversing with provider.

## 2016-06-26 NOTE — ED Notes (Signed)
He is drowsy and arouses easily. We are awaiting the arrival of his girlfriend for ride home.

## 2016-06-26 NOTE — ED Notes (Signed)
As I write this, he is undergoing upper endoscopy.

## 2016-06-26 NOTE — ED Notes (Signed)
ED Provider at bedside. 

## 2016-06-26 NOTE — ED Triage Notes (Signed)
Pt reports that he was eating chicken last night and feels like it is stuck in his throat.  History of same x 2-has had glucagon and also and an endoscopy for it before.  Esophagus stretched last year.  No respiratory distress noted, pt talking without hoarseness.    GI doctor is Dr Norma Fredricksontoledo.

## 2016-06-26 NOTE — Op Note (Signed)
[facility] Patient Name: Keith Stewart Procedure Date: 06/26/2016 MRN: [id number] Attending MD: Willaim RayasSteven P. Kelyn Koskela MD, MD Date of Birth: 19-Mar-1981 CSN: [id number] Age: [age] Admit Type: Outpatient Procedure:                Upper GI endoscopy Indications:              Food impaction, history of dysphagia with                            impactions in the past Providers:                Viviann SpareSteven P. Christen Wardrop MD, MD, Priscella MannAutumn Goldsmith, RN,                            Beryle BeamsJanie Billups, Technician Referring MD:              Medicines:                Fentanyl 75 micrograms IV, Midazolam 5 mg IV,                            Diphenhydramine 50 mg IV Complications:            No immediate complications. Estimated blood loss:                            Minimal. Estimated Blood Loss:     Estimated blood loss was minimal. Procedure:                Pre-Anesthesia Assessment:                           - Prior to the procedure, a History and Physical                            was performed, and patient medications and                            allergies were reviewed. The patient's tolerance of                            previous anesthesia was also reviewed. The risks                            and benefits of the procedure and the sedation                            options and risks were discussed with the patient.                            All questions were answered, and informed consent                            was obtained. Prior Anticoagulants: The patient has  taken no previous anticoagulant or antiplatelet                            agents. ASA Grade Assessment: II - A patient with                            mild systemic disease. After reviewing the risks                            and benefits, the patient was deemed in                            satisfactory condition to undergo the procedure.                           After obtaining informed  consent, the endoscope was                            passed under direct vision. Throughout the                            procedure, the patient's blood pressure, pulse, and                            oxygen saturations were monitored continuously. The                            [id number] ([id number]) scope was introduced through the                            mouth, and advanced to the body of the stomach. The                            upper GI endoscopy was accomplished without                            difficulty. The patient tolerated the procedure                            well. Scope In: Scope Out: Findings:      Food bolus (chicken) was found at the gastroesophageal junction. Removal       of food was accomplished by pushing the bolus into the stomach with mild       resistance. Edema and erythema / mild heme was noted at the site of       impaction, with suspected mild stricture. No dilation was performed       today given inflammation at the site.      Esophagogastric landmarks were identified: the Z-line was found at 40 cm       and the gastroesophageal junction was found at 40 cm from the incisors.       A small hiatal hernia was noted.      Mucosal changes including ringed esophagus and longitudinal furrows were       found in the entire esophagus otherwise, suggestive  of eosinophilic       esophagitis.      The cardia and gastric fundus were normal. Complete exam was not       performed in the setting of impaction Impression:               - Food impaction at the gastroesophageal junction                            as outlined above at site of suspected mild                            stricture. Removal was successful.                           - Esophagogastric landmarks identified.                           - Esophageal mucosal changes suggestive of                            eosinophilic esophagitis.                           - Normal cardia and gastric  fundus. Moderate Sedation:      Moderate (conscious) sedation was administered by the endoscopy nurse       and supervised by the endoscopist. The following parameters were       monitored: oxygen saturation, heart rate, blood pressure, and response       to care. Total physician intraservice time was 10 minutes. Recommendation:           - Patient has a contact number available for                            emergencies. The signs and symptoms of potential                            delayed complications were discussed with the                            patient. Return to normal activities tomorrow.                            Written discharge instructions were provided to the                            patient.                           - Full liquid diet today, advance to soft diet                            tomorrow                           - NO MEATs until follow up with your primary GI  physcian                           - Continue present medications                           - Start omeprazole 40mg  once daily for suspected                            eosinophilic esophagitis                           - Repeat upper endoscopy in 6-8 weeks on omeprazole                            to evaluate the response to therapy and more                            formally assess for eosinophilic esophagitis. Procedure Code(s):        --- Professional ---                           [phone], 52, Esophagogastroduodenoscopy, flexible,                            transoral; with removal of foreign body(s)                           99152, Moderate sedation services provided by the                            same physician or other qualified health care                            professional performing the diagnostic or                            therapeutic service that the sedation supports,                            requiring the presence of an independent trained                             observer to assist in the monitoring of the                            patient's level of consciousness and physiological                            status; initial 15 minutes of intraservice time,                            patient age 24 years or older Diagnosis Code(s):        --- Professional ---  Z61.096E, Food in esophagus causing other injury,                            initial encounter                           K20.9, Esophagitis, unspecified                           T18.108A, Unspecified foreign body in esophagus                            causing other injury, initial encounter CPT copyright 2016 American Medical Association. All rights reserved. The codes documented in this report are preliminary and upon coder review may  be revised to meet current compliance requirements. Viviann Spare P. Reymond Maynez MD, MD 06/26/2016 3:31:03 PM This report has been signed electronically. Number of Addenda: 0

## 2016-06-26 NOTE — Progress Notes (Signed)
Patient tolerated EGD very well. Vital signs stable throughout. ED RN available during and after procedure with what we needed. Report given to Electa Sniffim Smith, RN. Dr. Adela LankArmbruster did attempt to speak to patient following procedure and gave him a copy of the report.. Not sure what he will remember due to sedation.

## 2017-02-01 ENCOUNTER — Encounter: Payer: Self-pay | Admitting: Sports Medicine

## 2017-08-25 ENCOUNTER — Encounter (HOSPITAL_COMMUNITY)
Admission: EM | Disposition: A | Discharge status: Home / Self Care | Payer: Self-pay | Source: Home / Self Care | Attending: Emergency Medicine

## 2017-08-25 ENCOUNTER — Encounter (HOSPITAL_BASED_OUTPATIENT_CLINIC_OR_DEPARTMENT_OTHER): Payer: Self-pay | Admitting: *Deleted

## 2017-08-25 ENCOUNTER — Ambulatory Visit (HOSPITAL_BASED_OUTPATIENT_CLINIC_OR_DEPARTMENT_OTHER)
Admission: EM | Admit: 2017-08-25 | Discharge: 2017-08-25 | Disposition: A | Discharge status: Home / Self Care | Payer: BLUE CROSS/BLUE SHIELD | Attending: Emergency Medicine | Admitting: Emergency Medicine

## 2017-08-25 ENCOUNTER — Other Ambulatory Visit: Payer: Self-pay

## 2017-08-25 DIAGNOSIS — R1310 Dysphagia, unspecified: Secondary | ICD-10-CM | POA: Insufficient documentation

## 2017-08-25 DIAGNOSIS — K222 Esophageal obstruction: Secondary | ICD-10-CM

## 2017-08-25 DIAGNOSIS — K2980 Duodenitis without bleeding: Secondary | ICD-10-CM | POA: Diagnosis not present

## 2017-08-25 DIAGNOSIS — Z7951 Long term (current) use of inhaled steroids: Secondary | ICD-10-CM | POA: Insufficient documentation

## 2017-08-25 DIAGNOSIS — K200 Eosinophilic esophagitis: Secondary | ICD-10-CM

## 2017-08-25 DIAGNOSIS — X58XXXA Exposure to other specified factors, initial encounter: Secondary | ICD-10-CM | POA: Diagnosis not present

## 2017-08-25 DIAGNOSIS — K228 Other specified diseases of esophagus: Secondary | ICD-10-CM | POA: Insufficient documentation

## 2017-08-25 DIAGNOSIS — Z79899 Other long term (current) drug therapy: Secondary | ICD-10-CM | POA: Insufficient documentation

## 2017-08-25 DIAGNOSIS — T18128A Food in esophagus causing other injury, initial encounter: Secondary | ICD-10-CM | POA: Insufficient documentation

## 2017-08-25 HISTORY — PX: ESOPHAGOGASTRODUODENOSCOPY: SHX5428

## 2017-08-25 SURGERY — EGD (ESOPHAGOGASTRODUODENOSCOPY)
Anesthesia: Moderate Sedation

## 2017-08-25 MED ORDER — OMEPRAZOLE 40 MG PO CPDR: 40.0000 mg | DELAYED_RELEASE_CAPSULE | Freq: Every day | ORAL | 1 refills | Status: AC

## 2017-08-25 MED ORDER — DIPHENHYDRAMINE HCL 50 MG/ML IJ SOLN
INTRAMUSCULAR | Status: DC | PRN
  Administered 2017-08-25: 50 mg via INTRAVENOUS

## 2017-08-25 MED ORDER — FLUTICASONE PROPIONATE HFA 110 MCG/ACT IN AERO: 2.0000 | INHALATION_SPRAY | Freq: Two times a day (BID) | RESPIRATORY_TRACT | 2 refills | Status: AC

## 2017-08-25 MED ORDER — DIPHENHYDRAMINE HCL 50 MG/ML IJ SOLN
INTRAMUSCULAR | Status: AC
  Filled 2017-08-25: qty 1

## 2017-08-25 MED ORDER — MIDAZOLAM HCL 5 MG/ML IJ SOLN
INTRAMUSCULAR | Status: AC
  Filled 2017-08-25: qty 2

## 2017-08-25 MED ORDER — MIDAZOLAM HCL 10 MG/2ML IJ SOLN
INTRAMUSCULAR | Status: DC | PRN
  Administered 2017-08-25 (×3): 2 mg via INTRAVENOUS
  Administered 2017-08-25: 1 mg via INTRAVENOUS

## 2017-08-25 MED ORDER — FENTANYL CITRATE (PF) 100 MCG/2ML IJ SOLN
INTRAMUSCULAR | Status: DC | PRN
  Administered 2017-08-25 (×3): 25 ug via INTRAVENOUS

## 2017-08-25 MED ORDER — FENTANYL CITRATE (PF) 100 MCG/2ML IJ SOLN
INTRAMUSCULAR | Status: AC
  Filled 2017-08-25: qty 2

## 2017-08-25 MED ORDER — GLUCAGON HCL RDNA (DIAGNOSTIC) 1 MG IJ SOLR
1.0000 mg | Freq: Once | INTRAMUSCULAR | Status: AC
  Administered 2017-08-25: 1 mg via INTRAVENOUS
  Filled 2017-08-25: qty 1

## 2017-08-25 NOTE — ED Notes (Signed)
Pt returned from Endo. ?

## 2017-08-25 NOTE — ED Triage Notes (Signed)
Pt c/o piece of chicken in throat x 1 day, hx of same

## 2017-08-25 NOTE — Consult Note (Signed)
Reason for Consult: Food impaction-chicken. Referring Physician: ER-MD-Anthony Freida Busman, MD  Keith Stewart is an 37 y.o. male.  HPI: 37 year old white male here with a food impaction. He ate a chicken wing at about 9 PM last night and after that first bite he could not get anything down. He initially checked in at [facility] where he was given glucagon but that di not seem to help. He was therefore transferred here for an emergent endoscopy. He denies having any other GI symptoms at this time. He has a 4 year history of difficulty swallowing and has had an esophageal dilatation done about 3 years by Dr. Bing Plume at [facility] in [place], [place]. He denies having any problems acid reflux. He denies having a history of Eosinophilic esophagitis.      History reviewed. No pertinent past medical history.  Past Surgical History:  Procedure Laterality Date  . ESOPHAGEAL DILATION    . ESOPHAGOGASTRODUODENOSCOPY N/A 06/26/2016   Procedure: ESOPHAGOGASTRODUODENOSCOPY (EGD);  Surgeon: Ruffin Frederick, MD;  Location: [facility];  Service: Gastroenterology;  Laterality: N/A;   History reviewed. No pertinent family history.  Social History:  reports that he has never smoked. He has never used smokeless tobacco. He reports that he does not drink alcohol or use drugs.  Allergies: No Known Allergies  Medications: I have reviewed the patient's current medications.  No results found for this or any previous visit (from the past 48 hour(s)).  No results found.  Review of Systems  Constitutional: Negative.   HENT: Negative.   Eyes: Negative.   Cardiovascular: Negative.   Gastrointestinal: Negative.   Genitourinary: Negative.   Musculoskeletal: Negative.   Skin: Positive for rash.  Neurological: Negative.   Endo/Heme/Allergies: Negative.   Psychiatric/Behavioral: Negative.    Blood pressure 131/88, pulse 94, temperature 98.1 F (36.7 C), temperature source Oral, resp.  rate 18, height  (1.803 m), weight 88.5 kg (195 lb), SpO2 100 %. Physical Exam  Constitutional: He is oriented to person, place, and time. He appears well-developed and well-nourished.  HENT:  Head: Normocephalic and atraumatic.  Eyes: Pupils are equal, round, and reactive to light. Conjunctivae and EOM are normal.  Neck: Normal range of motion. Neck supple.  Cardiovascular: Normal rate and regular rhythm.  Respiratory: Effort normal and breath sounds normal.  GI: Soft. Bowel sounds are normal.  Neurological: He is alert and oriented to person, place, and time.  Skin: Skin is warm and dry.  Psychiatric: He has a normal mood and affect. His behavior is normal. Judgment and thought content normal.   Assessment/Plan: 1) Food impaction/sold food dysphagia-proceed with a colonoscopy at this time. 2) Psoriasis. Marian Meneely 08/25/2017, 7:21 PM

## 2017-08-25 NOTE — ED Notes (Signed)
Bed: WA08 Expected date:  Expected time:  Means of arrival:  Comments: Hold pt is in ENDO

## 2017-08-25 NOTE — Discharge Instructions (Addendum)
You had a food impaction, but your endoscopy also showed eosinophilic esophagitis which is where allergies can cause inflammation in the esophagus. Take the medications prescribed by Dr. Loreta Ave as directed (they were called into [facility] at [address] in [place]). Stay well hydrated and get plenty of rest, eat soft foods this weekend. Follow up with Dr. Kenna Gilbert office this coming week, call on Monday and make an appointment, for ongoing management of your condition and recheck of symptoms. Return to the ER for emergent changes or worsening symptoms.

## 2017-08-25 NOTE — ED Provider Notes (Addendum)
Care assumed from Dr. Adela Lank upon pt's transfer from [facility], please see their notes for full documentation of patient's complaint/HPI. Briefly, pt here with food impaction since last night, unable to tolerate PO since eating a large piece of chicken last night. Trial of glucagon and [employer]a-[employer] was attempted but failed. Pt was transferred here for GI Dr. Loreta Ave to perform endoscopy  Physical Exam  BP 131/88 (BP Location: Left Arm)   Pulse 94   Temp 98.1 F (36.7 C) (Oral)   Resp 18   Ht  (1.803 m)   Wt 88.5 kg (195 lb)   SpO2 100%   BMI 27.20 kg/m   Physical Exam Gen: afebrile, VSS, NAD HEENT: EOMI, MMM Resp: no resp distress CV: rate WNL Abd: appearance normal, nondistended MsK: moving all extremities with ease Neuro: A&O x4  ED Course/Procedures    No results found.   Meds ordered this encounter  Medications  . glucagon (human recombinant) (GLUCAGEN) injection 1 mg  . DISCONTD: diphenhydrAMINE (BENADRYL) injection  . DISCONTD: fentaNYL (SUBLIMAZE) injection  . DISCONTD: midazolam (VERSED) injection  . omeprazole (PRILOSEC) 40 MG capsule    Sig: Take 1 capsule (40 mg total) by mouth daily.    Dispense:  30 capsule    Refill:  1  . fluticasone (FLOVENT HFA) 110 MCG/ACT inhaler    Sig: Inhale 2 puffs into the lungs 2 (two) times daily.    Dispense:  1 Inhaler    Refill:  2     MDM:   ICD-10-CM   1. Esophageal obstruction due to food impaction K22.2    T18.128A   2. Eosinophilic esophagitis K20.0     7:02 PM- Pt arrived in ED, has no complaints at this time aside from still not being able to tolerate PO since last night. Will consult GI specialist and inform them of his arrival.   7:18 PM- Dr. Loreta Ave here to see pt, will be taking him to endoscopy. Please see her notes for further documentation of care. I appreciate their help with this pleasant pt's care. Pt stable at this time, has no requests, and Dr. Loreta Ave denies needing anything else done at this time on my end.   Will continue to monitor while he's here.   7:54 PM- Pt taken to endoscopy with Dr. Loreta Ave. Please see her notes for further documentation of care and dispo. Pt stable on time of admission/endoscopy transfer.    [address], [place], [place] 08/25/17 1956  ADDENDUM 8:46 PM- Dr. Loreta Ave states pt is back from endo, food bolus impaction was passed into stomach, endoscopy shows likely eosinophilic esophagitis so he will be started on PPIs and fluticasone for this (which were called in by her to his pharmacy). She will f/up with him outpatient. Pt ready for d/c, no further emergency department needs, pt awake and alert, understands his instructions. Advised soft foods this weekend and adequate hydration, and f/up with Dr. Kenna Gilbert office this coming week (call Monday for appt). His ride is on their way, will d/c home with them once they arrive. I explained the diagnosis and have given explicit precautions to return to the ER including for any other new or worsening symptoms. The patient understands and accepts the medical plan as it's been dictated and I have answered their questions. Discharge instructions concerning home care and prescriptions have been given. The patient is STABLE and is discharged to home in good condition.       [address], [place], [place] 08/25/17 2056    Silverio Lay,  Gonzella Lex, MD 08/25/17 [phone]

## 2017-08-25 NOTE — ED Provider Notes (Signed)
[facility] EMERGENCY DEPARTMENT Provider Note   CSN: [id number] Arrival date & time: 08/25/17  1434     History   Chief Complaint Chief Complaint  Patient presents with  . Food bolus    HPI Keith Stewart is a 37 y.o. male.  37 yo F with a chief complaint of esophageal food impaction.  The patient had a large piece of chicken last night and then has not been able to swallow anything since.  He is unable to tolerate liquids or solids.  He is tried a bit of caffeinated beverage which he thought brought some food back up but still has the same issues before.  He has had this problem happen to him at least twice in the past.  He has had his esophagus stretched 2 times previously.  Sees a GI doctor in [place].  Has had a food bolus removed at [facility] long by Dr. Adela Lank.  The history is provided by the patient.  Illness  This is a new problem. The current episode started 1 to 2 hours ago. The problem occurs constantly. The problem has not changed since onset.Pertinent negatives include no chest pain, no abdominal pain, no headaches and no shortness of breath. Nothing aggravates the symptoms. Nothing relieves the symptoms. He has tried nothing for the symptoms.    History reviewed. No pertinent past medical history.  Patient Active Problem List   Diagnosis Date Noted  . Food impaction of esophagus   . Esophageal foreign body 09/23/2014  . Psoriasis 06/17/2013  . Preventive measure 06/17/2013  . Perennial allergic rhinitis 06/17/2013    Past Surgical History:  Procedure Laterality Date  . ESOPHAGEAL DILATION    . ESOPHAGOGASTRODUODENOSCOPY N/A 06/26/2016   Procedure: ESOPHAGOGASTRODUODENOSCOPY (EGD);  Surgeon: Ruffin Frederick, MD;  Location: [facility];  Service: Gastroenterology;  Laterality: N/A;        Home Medications    Prior to Admission medications   Medication Sig Start Date End Date Taking? Authorizing Provider  fexofenadine (ALLEGRA) 180  MG tablet Take 1 tablet (180 mg total) by mouth daily. 06/17/13   Monica Becton, MD  fluticasone (FLONASE) 50 MCG/ACT nasal spray One spray in each nostril twice a day, use left hand for right nostril, and right hand for left nostril. 07/15/13   Monica Becton, MD  Multiple Vitamin (MULTIVITAMIN) tablet Take 1 tablet by mouth daily.    [provider]  omeprazole (PRILOSEC) 40 MG capsule Take 1 capsule (40 mg total) by mouth daily. 06/26/16   Cathren Laine, MD    Family History History reviewed. No pertinent family history.  Social History Social History   Tobacco Use  . Smoking status: Never Smoker  . Smokeless tobacco: Never Used  Substance Use Topics  . Alcohol use: No  . Drug use: No     Allergies   Patient has no known allergies.   Review of Systems Review of Systems  Constitutional: Negative for chills and fever.  HENT: Negative for congestion and facial swelling.   Eyes: Negative for discharge and visual disturbance.  Respiratory: Negative for shortness of breath.   Cardiovascular: Negative for chest pain and palpitations.  Gastrointestinal: Negative for abdominal pain, diarrhea and vomiting.  Musculoskeletal: Negative for arthralgias and myalgias.  Skin: Negative for color change and rash.  Neurological: Negative for tremors, syncope and headaches.  Psychiatric/Behavioral: Negative for confusion and dysphoric mood.     Physical Exam Updated Vital Signs BP 127/82 (BP Location: Left Arm)  Pulse 96   Temp 98.1 F (36.7 C) (Oral)   Resp 16   Ht  (1.803 m)   Wt 88.5 kg (195 lb)   SpO2 100%   BMI 27.20 kg/m   Physical Exam  Constitutional: He is oriented to person, place, and time. He appears well-developed and well-nourished.  HENT:  Head: Normocephalic and atraumatic.  Eyes: Pupils are equal, round, and reactive to light. EOM are normal.  Neck: Normal range of motion. Neck supple. No JVD present.  Cardiovascular: Normal rate  and regular rhythm. Exam reveals no gallop and no friction rub.  No murmur heard. Pulmonary/Chest: No respiratory distress. He has no wheezes.  Abdominal: He exhibits no distension. There is no rebound and no guarding.  Musculoskeletal: Normal range of motion.  Neurological: He is alert and oriented to person, place, and time.  Skin: No rash noted. No pallor.  Psychiatric: He has a normal mood and affect. His behavior is normal.  Nursing note and vitals reviewed.    ED Treatments / Results  Labs (all labs ordered are listed, but only abnormal results are displayed) Labs Reviewed - No data to display  EKG None  Radiology No results found.  Procedures Procedures (including critical care time)  Medications Ordered in ED Medications  glucagon (human recombinant) (GLUCAGEN) injection 1 mg (1 mg Intravenous Given 08/25/17 1627)     Initial Impression / Assessment and Plan / ED Course  I have reviewed the triage vital signs and the nursing notes.  Pertinent labs & imaging results that were available during my care of the patient were reviewed by me and considered in my medical decision making (see chart for details).     37 yo M with a chief complaint of a impacted food bolus.  Not able to tolerate liquids or solids.  Discussed with Paula,[facility].  Recommends doing a trial of glucagon and [employer] and then reevaluating.  Reassessed and continues to not be able to tolerate PO.  Repaged GI.  I discussed case with Dr. Loreta Ave she recommended that the patient be transferred to [facility] so that he could undergo endoscopy.  She requested that she be re-paged upon his arrival so that she could page the team in.  The patients results and plan were reviewed and discussed.   Any x-rays performed were independently reviewed by myself.   Differential diagnosis were considered with the presenting HPI.  Medications  glucagon (human recombinant) (GLUCAGEN) injection 1 mg (1 mg  Intravenous Given 08/25/17 1627)    Vitals:   08/25/17 1443  BP: 127/82  Pulse: 96  Resp: 16  Temp: 98.1 F (36.7 C)  TempSrc: Oral  SpO2: 100%  Weight: 88.5 kg (195 lb)  Height:  (1.803 m)    Final diagnoses:  Esophageal obstruction due to food impaction      Final Clinical Impressions(s) / ED Diagnoses   Final diagnoses:  Esophageal obstruction due to food impaction    ED Discharge Orders    None       Melene Plan, DO 08/25/17 1731

## 2017-08-25 NOTE — Op Note (Signed)
[facility] Patient Name: Keith Stewart Procedure Date: 08/25/2017 MRN: [id number] Attending MD: Charna Elizabeth , MD Date of Birth: 05-Mar-1981 CSN: [id number] Age: 37 Admit Type: Outpatient Procedure:                EGD with biopsies and removal of a food impaction. Indications:              Dysphagia, Food impaction-meat bolus impacted for                            about 36 hours. Providers:                Charna Elizabeth, MD, Priscella Myeesha Shane RN, RN, Kandice Robinsons, Technician Referring MD:              Medicines:                Fentanyl 75 micrograms, Midazolam 7 mg &                            Diphenhydramine 50 mg IV. Complications:            No immediate complications. Estimated Blood Loss:     Estimated blood loss was minimal. Procedure:                Pre-anesthesia assessment: - Prior to the                            procedure, a history and physical was performed,                            and patient medications and allergies were                            reviewed. The patient's tolerance of previous                            anesthesia was also reviewed. The risks and                            benefits of the procedure and the sedation options                            and risks were discussed with the patient. All                            questions were answered, and informed consent was                            obtained. Prior anticoagulants: The patient has                            taken no previous anticoagulant or antiplatelet  agents. ASA Grade assessment: II - A patient with                            mild systemic disease. After reviewing the risks                            and benefits, the patient was deemed in                            satisfactory condition to undergo the procedure.                            After obtaining informed consent, the endoscope was                             passed under direct vision. Throughout the                            procedure, the patient's blood pressure, pulse, and                            oxygen saturations were monitored continuously. The                            [id number] ( [id number] ) was introduced through the                            mouth, and advanced to the second part of duodenum.                            The EGD was accomplished with difficulty. The                            patient tolerated the procedure well. Scope In: Scope Out: Findings:      Mucosal changes including ringed esophagus, feline appearance,       longitudinal furrows, circumferential folds, longitudinal markings,       mucosal friability and vertical lines were found in the entire       esophagus. Esophageal findings were graded using the Eosinophilic       Esophagitis Endoscopic Reference Score (EoE-EREFS) as: Edema Grade 0       Normal (distinct vascular markings), Rings Grade 2 Moderate (distinct       rings that do not occlude passage of diagnostic 8-10 mm endoscope),       Exudates Grade 0 None (no white lesions seen), Furrows Grade 1 Present       (vertical lines with or without visible depth) and Stricture none (no       stricture found). Biopsies were taken with a cold forceps for histology.      A large meat bolus was noted impacted in the distal esophagus; a forceps       was passed into the bolus and it was gently pushed into the stomach.      The entire examined stomach was normal.      The cardia and gastric fundus were normal  on retroflexion.      There was mild duodenitis in the bulb; the rest of the examined duodenum       was normal. Impression:               - Esophageal mucosal changes suggestive of                            Eosinophilic Esophagitis-biopsies done.                           - Large meat bolus impacted at the lower end of the                            esophagus-removed successfully.                            - Normal appearing stomach.                           - Mild duodenitis in the duodenal bulb; otherwise                            normal examined duodenum. Moderate Sedation:      Moderate (conscious) sedation was administered by the endoscopy nurse       and supervised by the endoscopist. The following parameters were       monitored: oxygen saturation, heart rate, blood pressure, respiratory       rate, EKG, adequacy of pulmonary ventilation, and response to care.       Total physician intraservice time was 11 minutes. Recommendation:           - Soft diet for 2 days; regular diet                            thererafter-chew food well.                           - Continue present medications.                           - Use Prilosec (Omeprazole) 40 mg PO daily.                           - Flovent inhaler 2 puffs PO BID daily.                           - OP follow up in 2 weeks.                           - Avoid all NSAIDS for now. Procedure Code(s):        --- Professional ---                           [phone], Esophagogastroduodenoscopy, flexible,                            transoral; with removal of foreign body(s)  16109, Esophagogastroduodenoscopy, flexible,                            transoral; with biopsy, single or multiple Diagnosis Code(s):        --- Professional ---                           R13.10, Dysphagia, unspecified                           K22.8, Other specified diseases of esophagus                           T18.128A, Food in esophagus causing other injury,                            initial encounter CPT copyright 2017 [employer]. All rights reserved. The codes documented in this report are preliminary and upon coder review may  be revised to meet current compliance requirements. Charna Elizabeth, MD Charna Elizabeth, MD 08/25/2017 8:26:58 PM This report has been signed electronically. Number of Addenda: 0

## 2017-08-25 NOTE — ED Notes (Signed)
Bed: WA08 Expected date:  Expected time:  Means of arrival:  Comments: 

## 2017-08-25 NOTE — ED Notes (Signed)
Patient attempted to drink coke-was unable to keep down.  EDP made aware

## 2017-08-27 ENCOUNTER — Encounter (HOSPITAL_COMMUNITY): Payer: Self-pay | Admitting: Gastroenterology
